# Patient Record
Sex: Male | Born: 1955 | Race: White | Hispanic: No | Marital: Single | Smoking: Current every day smoker
Health system: Southern US, Community
[De-identification: ages and names within clinical notes are randomized; demographics above are authoritative.]

## PROBLEM LIST (undated history)

## (undated) DIAGNOSIS — F329 Major depressive disorder, single episode, unspecified: Secondary | ICD-10-CM

## (undated) DIAGNOSIS — F32A Depression, unspecified: Secondary | ICD-10-CM

## (undated) DIAGNOSIS — M109 Gout, unspecified: Secondary | ICD-10-CM

## (undated) DIAGNOSIS — F419 Anxiety disorder, unspecified: Secondary | ICD-10-CM

## (undated) DIAGNOSIS — I10 Essential (primary) hypertension: Secondary | ICD-10-CM

---

## 2005-01-26 ENCOUNTER — Emergency Department: Payer: Self-pay | Admitting: Emergency Medicine

## 2005-12-27 ENCOUNTER — Emergency Department: Payer: Self-pay | Admitting: General Practice

## 2006-06-01 ENCOUNTER — Emergency Department: Payer: Self-pay | Admitting: Emergency Medicine

## 2007-05-04 ENCOUNTER — Emergency Department: Payer: Self-pay | Admitting: Internal Medicine

## 2007-05-21 ENCOUNTER — Emergency Department: Payer: Self-pay | Admitting: Unknown Physician Specialty

## 2008-07-04 ENCOUNTER — Emergency Department: Payer: Self-pay | Admitting: Emergency Medicine

## 2009-05-25 ENCOUNTER — Emergency Department: Payer: Self-pay | Admitting: Unknown Physician Specialty

## 2010-01-21 ENCOUNTER — Emergency Department: Payer: Self-pay | Admitting: Emergency Medicine

## 2012-03-17 ENCOUNTER — Emergency Department: Payer: Self-pay | Admitting: Emergency Medicine

## 2012-03-17 LAB — CBC
HCT: 40.1 % (ref 40.0–52.0)
HGB: 13 g/dL (ref 13.0–18.0)
MCH: 25.9 pg — ABNORMAL LOW (ref 26.0–34.0)
MCHC: 32.5 g/dL (ref 32.0–36.0)
Platelet: 325 10*3/uL (ref 150–440)
RDW: 15.4 % — ABNORMAL HIGH (ref 11.5–14.5)

## 2012-03-17 LAB — URINALYSIS, COMPLETE
Bacteria: NONE SEEN
Ketone: NEGATIVE
Leukocyte Esterase: NEGATIVE
Nitrite: NEGATIVE
Ph: 5 (ref 4.5–8.0)
Protein: NEGATIVE
Squamous Epithelial: <1

## 2012-03-17 LAB — COMPREHENSIVE METABOLIC PANEL
Albumin: 4.5 g/dL (ref 3.4–5.0)
Alkaline Phosphatase: 92 U/L (ref 50–136)
Anion Gap: 11 (ref 7–16)
BUN: 16 mg/dL (ref 7–18)
Bilirubin,Total: 0.5 mg/dL (ref 0.2–1.0)
Calcium, Total: 9.2 mg/dL (ref 8.5–10.1)
Creatinine: 1.54 mg/dL — ABNORMAL HIGH (ref 0.60–1.30)
EGFR (African American): >60
EGFR (Non-African Amer.): 50 — ABNORMAL LOW
Glucose: 103 mg/dL — ABNORMAL HIGH (ref 65–99)
Osmolality: 281 (ref 275–301)
Potassium: 3.5 mmol/L (ref 3.5–5.1)

## 2012-03-17 LAB — DRUG SCREEN, URINE
Amphetamines, Ur Screen: NEGATIVE (ref ?–1000)
Benzodiazepine, Ur Scrn: NEGATIVE (ref ?–200)
Cannabinoid 50 Ng, Ur ~~LOC~~: NEGATIVE (ref ?–50)
MDMA (Ecstasy)Ur Screen: NEGATIVE (ref ?–500)
Opiate, Ur Screen: NEGATIVE (ref ?–300)

## 2012-03-17 LAB — ETHANOL: Ethanol: <3 mg/dL

## 2012-03-17 LAB — TSH: Thyroid Stimulating Horm: 0.94 u[IU]/mL

## 2012-07-19 ENCOUNTER — Ambulatory Visit: Payer: Self-pay

## 2012-08-01 ENCOUNTER — Encounter (HOSPITAL_COMMUNITY): Payer: Self-pay | Admitting: Emergency Medicine

## 2012-08-01 ENCOUNTER — Emergency Department (HOSPITAL_COMMUNITY)
Admission: EM | Admit: 2012-08-01 | Discharge: 2012-08-02 | Disposition: A | Discharge status: Home / Self Care | Payer: Self-pay | Attending: Emergency Medicine | Admitting: Emergency Medicine

## 2012-08-01 DIAGNOSIS — Z760 Encounter for issue of repeat prescription: Secondary | ICD-10-CM | POA: Insufficient documentation

## 2012-08-01 DIAGNOSIS — F1010 Alcohol abuse, uncomplicated: Secondary | ICD-10-CM | POA: Insufficient documentation

## 2012-08-01 DIAGNOSIS — M109 Gout, unspecified: Secondary | ICD-10-CM | POA: Insufficient documentation

## 2012-08-01 DIAGNOSIS — F17200 Nicotine dependence, unspecified, uncomplicated: Secondary | ICD-10-CM | POA: Insufficient documentation

## 2012-08-01 DIAGNOSIS — Z79899 Other long term (current) drug therapy: Secondary | ICD-10-CM | POA: Insufficient documentation

## 2012-08-01 DIAGNOSIS — F341 Dysthymic disorder: Secondary | ICD-10-CM | POA: Insufficient documentation

## 2012-08-01 DIAGNOSIS — I10 Essential (primary) hypertension: Secondary | ICD-10-CM | POA: Insufficient documentation

## 2012-08-01 DIAGNOSIS — M7989 Other specified soft tissue disorders: Secondary | ICD-10-CM | POA: Insufficient documentation

## 2012-08-01 HISTORY — DX: Major depressive disorder, single episode, unspecified: F32.9

## 2012-08-01 HISTORY — DX: Essential (primary) hypertension: I10

## 2012-08-01 HISTORY — DX: Depression, unspecified: F32.A

## 2012-08-01 HISTORY — DX: Gout, unspecified: M10.9

## 2012-08-01 HISTORY — DX: Anxiety disorder, unspecified: F41.9

## 2012-08-01 LAB — COMPREHENSIVE METABOLIC PANEL
ALT: 31 U/L (ref 0–53)
AST: 21 U/L (ref 0–37)
Alkaline Phosphatase: 82 U/L (ref 39–117)
CO2: 23 mEq/L (ref 19–32)
Calcium: 9.7 mg/dL (ref 8.4–10.5)
GFR calc Af Amer: >90 mL/min (ref >90)
GFR calc non Af Amer: >90 mL/min (ref >90)
Glucose, Bld: 93 mg/dL (ref 70–99)
Potassium: 3.6 mEq/L (ref 3.5–5.1)
Sodium: 142 mEq/L (ref 135–145)
Total Protein: 7.2 g/dL (ref 6.0–8.3)

## 2012-08-01 LAB — CBC WITH DIFFERENTIAL/PLATELET
Basophils Absolute: 0 10*3/uL (ref 0.0–0.1)
Lymphocytes Relative: 25 % (ref 12–46)
Lymphs Abs: 2.2 10*3/uL (ref 0.7–4.0)
Neutrophils Relative %: 68 % (ref 43–77)
Platelets: 283 10*3/uL (ref 150–400)
RBC: 4.82 MIL/uL (ref 4.22–5.81)
RDW: 14.4 % (ref 11.5–15.5)
WBC: 8.9 10*3/uL (ref 4.0–10.5)

## 2012-08-01 LAB — RAPID URINE DRUG SCREEN, HOSP PERFORMED
Barbiturates: NOT DETECTED
Benzodiazepines: NOT DETECTED
Cocaine: NOT DETECTED
Tetrahydrocannabinol: NOT DETECTED

## 2012-08-01 MED ORDER — VITAMIN B-1 100 MG PO TABS
100.0000 mg | ORAL_TABLET | Freq: Every day | ORAL | Status: DC
  Administered 2012-08-01: 100 mg via ORAL
  Filled 2012-08-01 (×2): qty 1

## 2012-08-01 MED ORDER — ACETAMINOPHEN 325 MG PO TABS: 650.0000 mg | ORAL_TABLET | ORAL | Status: DC | PRN

## 2012-08-01 MED ORDER — LORAZEPAM 1 MG PO TABS
0.0000 mg | ORAL_TABLET | Freq: Four times a day (QID) | ORAL | Status: DC
  Administered 2012-08-01: 1 mg via ORAL
  Filled 2012-08-01: qty 1

## 2012-08-01 MED ORDER — ADULT MULTIVITAMIN W/MINERALS CH
1.0000 | ORAL_TABLET | Freq: Every day | ORAL | Status: DC
  Administered 2012-08-01 – 2012-08-02 (×2): 1 via ORAL
  Filled 2012-08-01 (×2): qty 1

## 2012-08-01 MED ORDER — ONDANSETRON HCL 8 MG PO TABS: 4.0000 mg | ORAL_TABLET | Freq: Three times a day (TID) | ORAL | Status: DC | PRN

## 2012-08-01 MED ORDER — ASENAPINE MALEATE 10 MG SL SUBL
1.0000 | SUBLINGUAL_TABLET | Freq: Two times a day (BID) | SUBLINGUAL | Status: DC
  Administered 2012-08-01 – 2012-08-02 (×3): 10 mg via SUBLINGUAL
  Filled 2012-08-01 (×3): qty 1

## 2012-08-01 MED ORDER — VERAPAMIL HCL ER 240 MG PO TBCR
240.0000 mg | EXTENDED_RELEASE_TABLET | Freq: Two times a day (BID) | ORAL | Status: DC
  Administered 2012-08-01 – 2012-08-02 (×3): 240 mg via ORAL
  Filled 2012-08-01 (×3): qty 1

## 2012-08-01 MED ORDER — ALBUTEROL SULFATE HFA 108 (90 BASE) MCG/ACT IN AERS: 2.0000 | INHALATION_SPRAY | Freq: Four times a day (QID) | RESPIRATORY_TRACT | Status: DC | PRN

## 2012-08-01 MED ORDER — NICOTINE 21 MG/24HR TD PT24
21.0000 mg | MEDICATED_PATCH | Freq: Every day | TRANSDERMAL | Status: DC
  Administered 2012-08-01: 21 mg via TRANSDERMAL
  Filled 2012-08-01: qty 1

## 2012-08-01 MED ORDER — HYDROXYZINE HCL 25 MG PO TABS: 25.0000 mg | ORAL_TABLET | Freq: Four times a day (QID) | ORAL | Status: DC | PRN

## 2012-08-01 MED ORDER — FOLIC ACID 1 MG PO TABS
1.0000 mg | ORAL_TABLET | Freq: Every day | ORAL | Status: DC
  Administered 2012-08-01 – 2012-08-02 (×2): 1 mg via ORAL
  Filled 2012-08-01 (×2): qty 1

## 2012-08-01 MED ORDER — LORAZEPAM 2 MG/ML IJ SOLN: 1.0000 mg | Freq: Four times a day (QID) | INTRAMUSCULAR | Status: DC | PRN

## 2012-08-01 MED ORDER — THIAMINE HCL 100 MG/ML IJ SOLN: 100.0000 mg | Freq: Every day | INTRAMUSCULAR | Status: DC

## 2012-08-01 MED ORDER — LORAZEPAM 1 MG PO TABS: 0.0000 mg | ORAL_TABLET | Freq: Two times a day (BID) | ORAL | Status: DC

## 2012-08-01 MED ORDER — LORAZEPAM 1 MG PO TABS
1.0000 mg | ORAL_TABLET | Freq: Four times a day (QID) | ORAL | Status: DC | PRN
  Administered 2012-08-01: 1 mg via ORAL
  Filled 2012-08-01: qty 1

## 2012-08-01 NOTE — ED Provider Notes (Signed)
History  This chart was scribed for Kurt Octave, MD by Bennett Scrape. This patient was seen in room TR04C/TR04C and the patient's care was started at 11:18AM.  CSN: [id number]  Arrival date & time 08/01/12  1052   First MD Initiated Contact with Patient 08/01/12 1118      Chief Complaint  Patient presents with  . Medical Clearance  . Medication Refill    The history is provided by the patient. No language interpreter was used.    Kurt Harris is a 56 y.o. male who presents to the Emergency Department requesting to be medically cleared for admission to [facility]. He has being at [facility] for alcohol abuse until this morning but was turned out due to financial issues.  He states that he has been talking with the director of [facility] and reports that he was offered a spot if he could be medically cleared first. He reports that his last alcohol consuption was 2 hours ago. He denies drinking everyday stating that it's "only when I have issues". He c/o bilateral leg swelling and reports that he does usually get tremors when going through EtOH withdrawal. He denies CP, abdominal pain and SOB. He ha sa  H/o HTN, gout (no recent episodes) and depression and reports that he takes his daily medications as prescribed but is almost out of his Verapamil and Atarax. He is also a current everyday smoker.  Past Medical History  Diagnosis Date  . Hypertension   . Gout   . Depression   . Anxiety     History reviewed. No pertinent past surgical history.  History reviewed. No pertinent family history.  History  Substance Use Topics  . Smoking status: Current Everyday Smoker  . Smokeless tobacco: Not on file  . Alcohol Use: No      Review of Systems  A complete 10 system review of systems was obtained and all systems are negative except as noted in the HPI and PMH.   Allergies  Lisinopril  Home Medications   Current Outpatient Rx  Name Route Sig Dispense Refill  .  ALBUTEROL SULFATE HFA 108 (90 BASE) MCG/ACT IN AERS Inhalation Inhale 2 puffs into the lungs every 6 (six) hours as needed. For wheezing    . ASENAPINE MALEATE 10 MG SL SUBL Sublingual Place 1 tablet under the tongue 2 (two) times daily.    Marland Kitchen HYDROXYZINE HCL 25 MG PO TABS Oral Take 25 mg by mouth every 6 (six) hours as needed. For anxiety    . VERAPAMIL HCL ER 240 MG PO TBCR Oral Take 240 mg by mouth 2 (two) times daily.      Triage Vitals: BP 151/90  Pulse 105  Temp 98.5 F (36.9 C) (Oral)  Resp 18  SpO2 98%  Physical Exam  Nursing note and vitals reviewed. Constitutional: He is oriented to person, place, and time. He appears well-developed and well-nourished. No distress.       smells of alcohol  HENT:  Head: Normocephalic and atraumatic.  Eyes: Conjunctivae and EOM are normal.  Neck: Normal range of motion. Neck supple.  Cardiovascular: Tachycardia present.        Mildly tachycardic  Pulmonary/Chest: Effort normal and breath sounds normal.  Abdominal: Soft. There is no tenderness.  Musculoskeletal: Normal range of motion. He exhibits edema (1+ pitting edema bilaterally).  Neurological: He is alert and oriented to person, place, and time. He has normal strength. No sensory deficit. He displays no seizure activity.  Skin:  Skin is warm and dry.  Psychiatric: He has a normal mood and affect.    ED Course  Procedures (including critical care time)  DIAGNOSTIC STUDIES: Oxygen Saturation is 98% on room air, normal by my interpretation.    COORDINATION OF CARE: 11:34AM-Discussed treatment plan which includes blood work with pt at bedside and pt agreed to plan.   Labs Reviewed  CBC WITH DIFFERENTIAL - Abnormal; Notable for the following:    Hemoglobin 12.4 (*)     HCT 37.9 (*)     MCH 25.7 (*)     All other components within normal limits  ETHANOL - Abnormal; Notable for the following:    Alcohol, Ethyl (B) 29 (*)     All other components within normal limits    COMPREHENSIVE METABOLIC PANEL  URINE RAPID DRUG SCREEN (HOSP PERFORMED)   No results found.   No diagnosis found.    MDM  Alcoholic who is now homeless.  Requesting medical clearance for [facility].  No SI or HI.  Last drink just PTA.  Labs, CIWA protocol, d/w ACT team.   I personally performed the services described in this documentation, which was scribed in my presence.  The recorded information has been reviewed and considered.       Kurt Octave, MD 08/01/12 1308

## 2012-08-01 NOTE — ED Notes (Signed)
Requested order for a nicotine patch and placed in the computer as a verbal order from Dr. Rubin Payor

## 2012-08-01 NOTE — BH Assessment (Signed)
Assessment Note   Kurt Harris is an 56 y.o. male that is self-referred for SA treatment.  Pt stated he called [facility] and was told he had to be med cleared and go through detox before treatment.  Pt stated he wants detox from alcohol.  Pt stated he was recently released from [facility] (did not give date) from treatment, went to [facility] and did not have money to stay there, so was kicked out be report.  Pt reports he has been drinking 2-3 6 packs of beer daily, last use today and pt had one 24 oz beer.  Pt denies current mental health issues other than some depressive sx.  Pt stated he has a diagnosis of Bipolar Disorder and received his prescription for psychotropic medication from [facility].  Pt denies SI/HI or psychosis.  Pt has had no other SA or MH treatment by report other than his recent treatment at [facility].  Called [facility] and beds available per [facility] @ 1305.  Referral faxed for review.  Updated ED staff.  Completed assessment, assessment notification and faxed to [facility] to log.                                       Axis I: Alcohol Dependence, Bipolar I Disorder, MRE Depressed, Moderate Axis II: Deferred Axis III:  Past Medical History  Diagnosis Date  . Hypertension   . Gout   . Depression   . Anxiety    Axis IV: housing problems, occupational problems and other psychosocial or environmental problems Axis V: 31-40 impairment in reality testing  Past Medical History:  Past Medical History  Diagnosis Date  . Hypertension   . Gout   . Depression   . Anxiety     History reviewed. No pertinent past surgical history.  Family History: History reviewed. No pertinent family history.  Social History:  reports that he has been smoking.  He does not have any smokeless tobacco history on file. He reports that he drinks alcohol. He reports that he does not use illicit drugs.  Additional Social History:  Alcohol / Drug Use Pain Medications: see list Prescriptions: see list Over the Counter: see  list History of alcohol / drug use?: Yes Substance #1 Name of Substance 1: Alcohol - Beer 1 - Age of First Use: 16 1 - Amount (size/oz): 2-3 6 packs beer per day 1 - Frequency: Daily 1 - Duration: Ongoing 1 - Last Use / Amount: today - 1 beer  CIWA: CIWA-Ar BP: 151/90 mmHg Pulse Rate: 105  Nausea and Vomiting: no nausea and no vomiting Tactile Disturbances: none Tremor: no tremor Auditory Disturbances: not present Paroxysmal Sweats: no sweat visible Visual Disturbances: not present Anxiety: five Headache, Fullness in Head: none present Agitation: normal activity Orientation and Clouding of Sensorium: oriented and can do serial additions CIWA-Ar Total: 5  COWS:    Allergies:  Allergies  Allergen Reactions  . Lisinopril Rash    Home Medications:  (Not in a hospital admission)  OB/GYN Status:  No LMP for male patient.  General Assessment Data Location of Assessment: [facility] ED Living Arrangements: Other (Comment) (Homeless) Can pt return to current living arrangement?: Yes Admission Status: Voluntary Is patient capable of signing voluntary admission?: Yes Transfer from: Acute Hospital Referral Source: Self/Family/Friend  Education Status Is patient currently in school?: No  Risk to self Suicidal Ideation: No Suicidal Intent: No Is patient at  risk for suicide?: No Suicidal Plan?: No Access to Means: No What has been your use of drugs/alcohol within the last 12 months?: Dailys use of alcohol Previous Attempts/Gestures: No How many times?: 0  Other Self Harm Risks: pt denies Triggers for Past Attempts: None known Intentional Self Injurious Behavior: None Family Suicide History: No Recent stressful life event(s): Other (Comment);Financial Problems (pt is homeless) Persecutory voices/beliefs?: No Depression: Yes Depression Symptoms: Despondent;Insomnia;Feeling worthless/self pity;Feeling angry/irritable Substance abuse history and/or treatment for substance  abuse?: Yes Suicide prevention information given to non-admitted patients: Not applicable  Risk to Others Homicidal Ideation: No Thoughts of Harm to Others: No Current Homicidal Intent: No Current Homicidal Plan: No Access to Homicidal Means: No Identified Victim: na History of harm to others?: No Assessment of Violence: None Noted Violent Behavior Description: na - pt calm, cooperative Does patient have access to weapons?: No Criminal Charges Pending?: No Does patient have a court date: No  Psychosis Hallucinations: None noted Delusions: None noted  Mental Status Report Appear/Hygiene: Other (Comment) (casual) Eye Contact: Good Motor Activity: Unremarkable Speech: Logical/coherent Level of Consciousness: Alert Mood: Depressed Affect: Appropriate to circumstance Anxiety Level: Minimal Thought Processes: Coherent;Relevant Judgement: Impaired Orientation: Person;Place;Time;Situation Obsessive Compulsive Thoughts/Behaviors: None  Cognitive Functioning Concentration: Normal Memory: Recent Intact;Remote Intact IQ: Average Insight: Fair Impulse Control: Poor Appetite: Good Weight Loss: 0  Weight Gain: 0  Sleep: Decreased Total Hours of Sleep:  (4-5 hrs per night) Vegetative Symptoms: None  ADLScreening [facility] Assessment Services) Patient's cognitive ability adequate to safely complete daily activities?: Yes Patient able to express need for assistance with ADLs?: Yes Independently performs ADLs?: Yes (appropriate for developmental age)  Abuse/Neglect [facility]) Physical Abuse: Denies Verbal Abuse: Yes, past (Comment) (states was bullied at school as a child) Sexual Abuse: Denies  Prior Inpatient Therapy Prior Inpatient Therapy: Yes Prior Therapy Dates: 2013 Prior Therapy Facilty/Provider(s): [facility] Reason for Treatment: SA treatment  Prior Outpatient Therapy Prior Outpatient Therapy: No Prior Therapy Dates: na Prior Therapy Facilty/Provider(s): na Reason for  Treatment: na  ADL Screening (condition at time of admission) Patient's cognitive ability adequate to safely complete daily activities?: Yes Patient able to express need for assistance with ADLs?: Yes Independently performs ADLs?: Yes (appropriate for developmental age) Weakness of Legs: None Weakness of Arms/Hands: None  Home Assistive Devices/Equipment Home Assistive Devices/Equipment: None    Abuse/Neglect Assessment (Assessment to be complete while patient is alone) Physical Abuse: Denies Verbal Abuse: Yes, past (Comment) (states was bullied at school as a child) Sexual Abuse: Denies Exploitation of patient/patient's resources: Denies Self-Neglect: Denies Values / Beliefs Cultural Requests During Hospitalization: None Spiritual Requests During Hospitalization: None Consults Spiritual Care Consult Needed: No Social Work Consult Needed: No Merchant navy officer (For Healthcare) Advance Directive: Patient does not have advance directive;Patient would not like information    Additional Information 1:1 In Past 12 Months?: No CIRT Risk: No Elopement Risk: No Does patient have medical clearance?: Yes     Disposition:  Disposition Disposition of Patient: Referred to;Inpatient treatment program (Penidng [facility]) Type of inpatient treatment program: Adult Patient referred to: [facility] ([facility]ending [facility])  On Site Evaluation by:   Reviewed with Physician:  Rancour   Caryl Comes 08/01/2012 1:40 PM

## 2012-08-01 NOTE — ED Notes (Signed)
Pt here requesting medical clearance so can go to [facility] for housing until another facility can be located; pt sts almost out of meds also; pt has not other complaint

## 2012-08-01 NOTE — ED Notes (Signed)
Pt states he spoke with Eber Jones [phone]) at [facility] and was informed that he would have placement following medical clearance.  Pt states he was staying at [facility] but was evicted last night due to inability to pay for room.  Pt states he is "running low" on medications and is need assistance in getting his rx filled.

## 2012-08-02 MED ORDER — VERAPAMIL HCL ER 240 MG PO TBCR: 240.0000 mg | EXTENDED_RELEASE_TABLET | Freq: Two times a day (BID) | ORAL | Status: AC

## 2012-08-02 MED ORDER — HYDROXYZINE HCL 25 MG PO TABS: 25.0000 mg | ORAL_TABLET | Freq: Four times a day (QID) | ORAL | Status: AC | PRN

## 2012-08-02 MED ORDER — ASENAPINE MALEATE 10 MG SL SUBL: 1.0000 | SUBLINGUAL_TABLET | Freq: Two times a day (BID) | SUBLINGUAL | Status: AC

## 2012-08-02 MED ORDER — ALBUTEROL SULFATE HFA 108 (90 BASE) MCG/ACT IN AERS: 2.0000 | INHALATION_SPRAY | Freq: Four times a day (QID) | RESPIRATORY_TRACT | Status: AC | PRN

## 2012-08-02 NOTE — ED Notes (Signed)
[name]-case mgmt-[phone].

## 2012-08-02 NOTE — BH Assessment (Signed)
[facility] Assessment Progress Note    Patient is wanting to go to [facility] for treatment.  This clinician called [facility] and spoke to [place] there and he said that patient had been uncooperative when he stayed there last and is not able to return at this time.   Clinician called [facility] and Dennie Bible there said that he looked like a good candidate for a rehabilitation bed but that he would have to apply during the day or at least have the daytime clinician call about trying to get him into this type of bed during the day.  Dennie Bible also said that the patient would need a two week supply of his home medications that are listed in the ED notes.  At this time patient would come up short on his hydroxizine and trazadone.  Patient is unsure about whether he is taking his medications as prescribed anyway.  Saying "I don't know what I'm taking and don't take it all the time anyway." Clinician did call [facility] in [place] to see if they had any opening and they do not.  They did say that we could call them back later in the morning. Patient is upset, saying he does not want to leave until he has a bed someplace.  He does not accept that he may have to apply for the services himself or that he will simply have to wait until daytime.  He says, "I don't want to have to go out and be wandering the streets." On-coming clinician should contact hospital social work regarding whether patient can get some of his medications filled to be able to go to [facility] for 14 days.

## 2012-08-02 NOTE — ED Notes (Signed)
This rn spoke with [name]-case management- stated pt could leave once doc orders 2 weeks of meds!  This rn advised dr. Ignacia Palma.  [name] stated [facility] picks up pt.

## 2012-08-02 NOTE — ED Notes (Signed)
Housekeeper asked pt could she come in and clean/straighten up the room, pt agreed. Then after a few minutes of her being in the room, pt didn't want the housekeeper in his room any longer. Housekeeper came out and said she couldn't finish cleaning the room because the pt stated "you shouldn't clean when pt's are in the room. You can go now"; RN notified

## 2012-08-02 NOTE — ED Notes (Signed)
Pt upset and wants to know when he is going to leave; told pt I would let the nurse know; RN notified

## 2012-08-02 NOTE — ED Notes (Signed)
[name] delivered Rx from Dr Ignacia Palma to pharmacy to fill.  [name] aware pt is up for discharge.

## 2012-08-02 NOTE — Progress Notes (Signed)
Contacted by SW Delice Bison to obtain 14-days of medications for patient so he can go to [facility] today. Notified attending nurse that we need scripts so can get filled.

## 2012-08-02 NOTE — ED Notes (Signed)
Spoke with case mgr-[name]-she called and advised catherine with ACT team that 2 weeks supply of drugs in POD C.  Pharmacy unable to fill Asenapine Maleate.

## 2012-08-02 NOTE — ED Notes (Signed)
Pt refused ativan- pt not exhibiting any s/sx of anxiety and/or agitation.

## 2012-09-02 ENCOUNTER — Emergency Department: Payer: Self-pay | Admitting: *Deleted

## 2012-09-02 LAB — CBC
HCT: 39.6 % — ABNORMAL LOW (ref 40.0–52.0)
MCV: 77 fL — ABNORMAL LOW (ref 80–100)
Platelet: 309 10*3/uL (ref 150–440)
RBC: 5.12 10*6/uL (ref 4.40–5.90)
WBC: 14.9 10*3/uL — ABNORMAL HIGH (ref 3.8–10.6)

## 2012-09-02 LAB — URINALYSIS, COMPLETE
Bacteria: NONE SEEN
Blood: NEGATIVE
Glucose,UR: NEGATIVE mg/dL (ref 0–75)
Leukocyte Esterase: NEGATIVE
Nitrite: NEGATIVE

## 2012-09-02 LAB — DRUG SCREEN, URINE
Amphetamines, Ur Screen: NEGATIVE (ref ?–1000)
Barbiturates, Ur Screen: NEGATIVE (ref ?–200)
Benzodiazepine, Ur Scrn: NEGATIVE (ref ?–200)
Cannabinoid 50 Ng, Ur ~~LOC~~: NEGATIVE (ref ?–50)
Cocaine Metabolite,Ur ~~LOC~~: NEGATIVE (ref ?–300)
Methadone, Ur Screen: NEGATIVE (ref ?–300)
Phencyclidine (PCP) Ur S: NEGATIVE (ref ?–25)
Tricyclic, Ur Screen: NEGATIVE (ref ?–1000)

## 2012-09-02 LAB — COMPREHENSIVE METABOLIC PANEL
Albumin: 4.5 g/dL (ref 3.4–5.0)
Alkaline Phosphatase: 97 U/L (ref 50–136)
Bilirubin,Total: 0.9 mg/dL (ref 0.2–1.0)
Creatinine: 0.89 mg/dL (ref 0.60–1.30)
Glucose: 94 mg/dL (ref 65–99)
Osmolality: 271 (ref 275–301)
Sodium: 136 mmol/L (ref 136–145)
Total Protein: 7.8 g/dL (ref 6.4–8.2)

## 2012-09-02 LAB — ETHANOL: Ethanol %: <0.003 % (ref 0.000–0.080)

## 2012-09-17 ENCOUNTER — Emergency Department: Payer: Self-pay | Admitting: Emergency Medicine

## 2012-09-17 LAB — CBC
HCT: 40.2 % (ref 40.0–52.0)
HGB: 12.9 g/dL — ABNORMAL LOW (ref 13.0–18.0)
MCV: 78 fL — ABNORMAL LOW (ref 80–100)
WBC: 13 10*3/uL — ABNORMAL HIGH (ref 3.8–10.6)

## 2012-09-17 LAB — URINALYSIS, COMPLETE
Bacteria: NONE SEEN
Bilirubin,UR: NEGATIVE
Blood: NEGATIVE
Glucose,UR: NEGATIVE mg/dL (ref 0–75)
Leukocyte Esterase: NEGATIVE
Nitrite: NEGATIVE
Squamous Epithelial: NONE SEEN
WBC UR: NONE SEEN /HPF (ref 0–5)

## 2012-09-17 LAB — BASIC METABOLIC PANEL
Calcium, Total: 9.3 mg/dL (ref 8.5–10.1)
Co2: 26 mmol/L (ref 21–32)
EGFR (Non-African Amer.): >60
Osmolality: 277 (ref 275–301)
Potassium: 4.2 mmol/L (ref 3.5–5.1)
Sodium: 138 mmol/L (ref 136–145)

## 2012-09-17 LAB — DRUG SCREEN, URINE
Barbiturates, Ur Screen: NEGATIVE (ref ?–200)
Benzodiazepine, Ur Scrn: NEGATIVE (ref ?–200)
Methadone, Ur Screen: NEGATIVE (ref ?–300)
Opiate, Ur Screen: NEGATIVE (ref ?–300)

## 2012-09-17 LAB — ACETAMINOPHEN LEVEL: Acetaminophen: <2 ug/mL

## 2012-12-25 ENCOUNTER — Emergency Department: Payer: Self-pay | Admitting: Emergency Medicine

## 2012-12-25 LAB — URINALYSIS, COMPLETE
Bilirubin,UR: NEGATIVE
Glucose,UR: NEGATIVE mg/dL (ref 0–75)
Nitrite: NEGATIVE
Ph: 5 (ref 4.5–8.0)
Protein: NEGATIVE
RBC,UR: <1 /HPF (ref 0–5)
Specific Gravity: 1.018 (ref 1.003–1.030)
Squamous Epithelial: NONE SEEN

## 2012-12-25 LAB — LIPASE, BLOOD: Lipase: 1012 U/L — ABNORMAL HIGH (ref 73–393)

## 2012-12-25 LAB — COMPREHENSIVE METABOLIC PANEL
Albumin: 4.6 g/dL (ref 3.4–5.0)
Anion Gap: 7 (ref 7–16)
Calcium, Total: 9.2 mg/dL (ref 8.5–10.1)
Creatinine: 0.93 mg/dL (ref 0.60–1.30)
Osmolality: 280 (ref 275–301)
Potassium: 3.9 mmol/L (ref 3.5–5.1)
SGOT(AST): 18 U/L (ref 15–37)
Total Protein: 7.7 g/dL (ref 6.4–8.2)

## 2012-12-25 LAB — CBC
HCT: 38.6 % — ABNORMAL LOW (ref 40.0–52.0)
MCV: 80 fL (ref 80–100)
Platelet: 296 10*3/uL (ref 150–440)

## 2013-01-06 ENCOUNTER — Emergency Department: Payer: Self-pay | Admitting: Emergency Medicine

## 2013-01-06 LAB — COMPREHENSIVE METABOLIC PANEL
Alkaline Phosphatase: 95 U/L (ref 50–136)
Bilirubin,Total: 0.4 mg/dL (ref 0.2–1.0)
Calcium, Total: 9.2 mg/dL (ref 8.5–10.1)
Chloride: 102 mmol/L (ref 98–107)
Creatinine: 0.94 mg/dL (ref 0.60–1.30)
EGFR (African American): >60
Glucose: 85 mg/dL (ref 65–99)
Osmolality: 272 (ref 275–301)
Potassium: 3.9 mmol/L (ref 3.5–5.1)
Total Protein: 8 g/dL (ref 6.4–8.2)

## 2013-01-06 LAB — CBC
HCT: 40.6 % (ref 40.0–52.0)
MCH: 25.5 pg — ABNORMAL LOW (ref 26.0–34.0)
MCHC: 32 g/dL (ref 32.0–36.0)
Platelet: 298 10*3/uL (ref 150–440)
RDW: 14.8 % — ABNORMAL HIGH (ref 11.5–14.5)

## 2013-01-06 LAB — URINALYSIS, COMPLETE
Bilirubin,UR: NEGATIVE
Glucose,UR: NEGATIVE mg/dL (ref 0–75)
Ketone: NEGATIVE
Nitrite: NEGATIVE
Squamous Epithelial: NONE SEEN

## 2013-05-19 ENCOUNTER — Emergency Department: Payer: Self-pay | Admitting: Emergency Medicine

## 2013-05-19 LAB — COMPREHENSIVE METABOLIC PANEL
Albumin: 4.3 g/dL (ref 3.4–5.0)
Alkaline Phosphatase: 81 U/L (ref 50–136)
Bilirubin,Total: 0.3 mg/dL (ref 0.2–1.0)
Creatinine: 1.14 mg/dL (ref 0.60–1.30)
EGFR (African American): >60
EGFR (Non-African Amer.): >60
Osmolality: 272 (ref 275–301)
Potassium: 3.7 mmol/L (ref 3.5–5.1)
Sodium: 135 mmol/L — ABNORMAL LOW (ref 136–145)
Total Protein: 7.4 g/dL (ref 6.4–8.2)

## 2013-05-19 LAB — LIPASE, BLOOD: Lipase: 153 U/L (ref 73–393)

## 2013-05-19 LAB — CBC: Platelet: 298 10*3/uL (ref 150–440)

## 2013-07-09 ENCOUNTER — Emergency Department: Payer: Self-pay | Admitting: Emergency Medicine

## 2013-07-09 LAB — DRUG SCREEN, URINE
Barbiturates, Ur Screen: NEGATIVE (ref ?–200)
Benzodiazepine, Ur Scrn: NEGATIVE (ref ?–200)
Cannabinoid 50 Ng, Ur ~~LOC~~: NEGATIVE (ref ?–50)
MDMA (Ecstasy)Ur Screen: NEGATIVE (ref ?–500)
Phencyclidine (PCP) Ur S: NEGATIVE (ref ?–25)

## 2013-07-09 LAB — COMPREHENSIVE METABOLIC PANEL
Anion Gap: 5 — ABNORMAL LOW (ref 7–16)
Bilirubin,Total: 1 mg/dL (ref 0.2–1.0)
Calcium, Total: 9.1 mg/dL (ref 8.5–10.1)
EGFR (African American): >60
EGFR (Non-African Amer.): >60
Osmolality: 270 (ref 275–301)
SGOT(AST): 18 U/L (ref 15–37)
SGPT (ALT): 22 U/L (ref 12–78)
Sodium: 136 mmol/L (ref 136–145)

## 2013-07-09 LAB — URINALYSIS, COMPLETE
Glucose,UR: NEGATIVE mg/dL (ref 0–75)
Ketone: NEGATIVE
Nitrite: NEGATIVE
Protein: NEGATIVE
RBC,UR: NONE SEEN /HPF (ref 0–5)
Specific Gravity: 1.002 (ref 1.003–1.030)
Squamous Epithelial: NONE SEEN

## 2013-07-09 LAB — ETHANOL: Ethanol %: 0.156 % — ABNORMAL HIGH (ref 0.000–0.080)

## 2013-07-09 LAB — CBC
HCT: 38.1 % — ABNORMAL LOW (ref 40.0–52.0)
MCH: 25.7 pg — ABNORMAL LOW (ref 26.0–34.0)
MCHC: 32.8 g/dL (ref 32.0–36.0)
MCV: 78 fL — ABNORMAL LOW (ref 80–100)
Platelet: 324 10*3/uL (ref 150–440)
RBC: 4.86 10*6/uL (ref 4.40–5.90)
RDW: 16.2 % — ABNORMAL HIGH (ref 11.5–14.5)

## 2014-02-23 IMAGING — CR DG ABDOMEN 1V
1 series · 1 of 1 positions shown · non-contrast
Comparison: none

REASON FOR EXAM: abdominal fullness
COMMENTS:   May transport without cardiac monitor

[t abdomen supine]
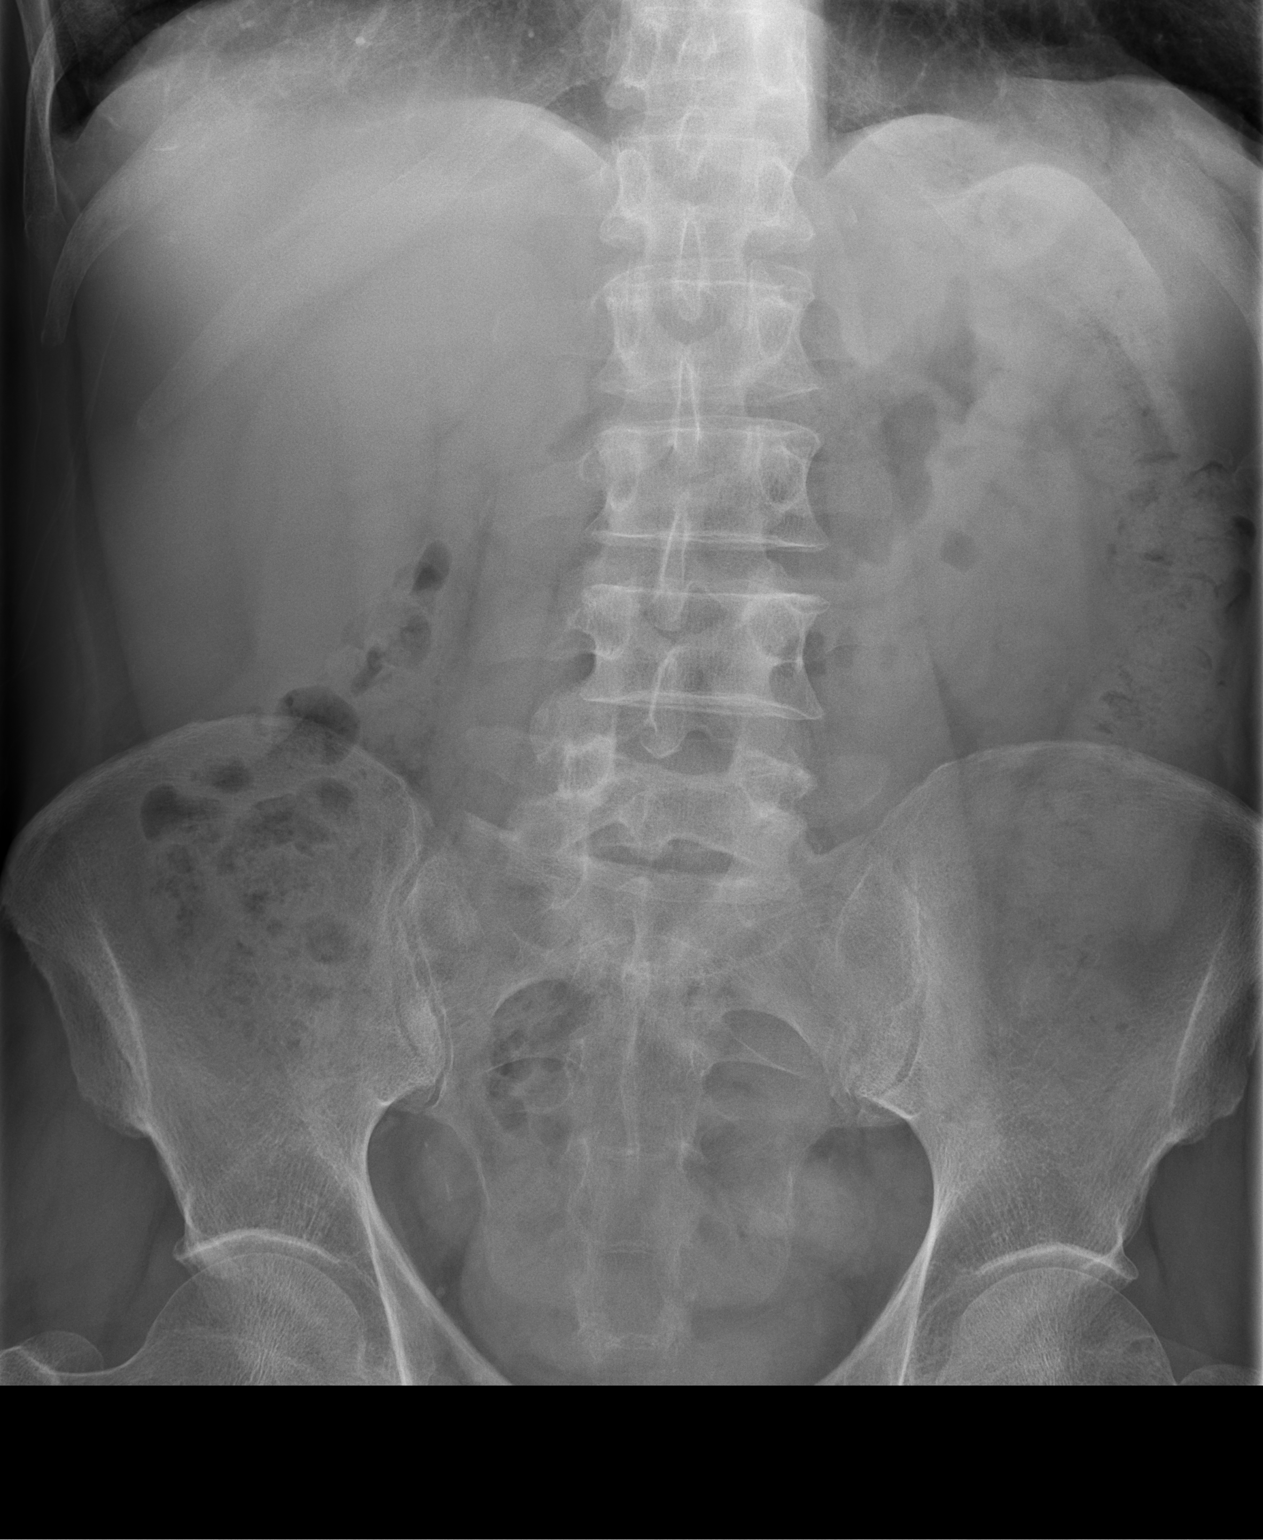

[1 of 1 positions shown; findings below may reference images not displayed]

PROCEDURE:     DXR - DXR KIDNEY URETER BLADDER  - May 19, 2013  [DATE]

RESULT:     The bowel gas pattern is within the limits of normal. There is
no evidence of a small or large bowel obstruction or ileus. I cannot exclude
mild constipation in the appropriate clinical setting. No abnormal soft
tissue calcifications are demonstrated. The bony structures are normal in
appearance.
IMPRESSION: No acute intra-abdominal abnormality is demonstrated.

[REDACTED]

## 2014-05-20 ENCOUNTER — Emergency Department: Payer: Self-pay | Admitting: Internal Medicine

## 2014-05-20 LAB — CBC
HCT: 35.4 % — ABNORMAL LOW (ref 40.0–52.0)
HGB: 11.1 g/dL — ABNORMAL LOW (ref 13.0–18.0)
MCH: 24.8 pg — ABNORMAL LOW (ref 26.0–34.0)
MCHC: 31.5 g/dL — ABNORMAL LOW (ref 32.0–36.0)
MCV: 79 fL — AB (ref 80–100)
Platelet: 276 10*3/uL (ref 150–440)
RBC: 4.49 10*6/uL (ref 4.40–5.90)
RDW: 15.5 % — AB (ref 11.5–14.5)
WBC: 9.8 10*3/uL (ref 3.8–10.6)

## 2014-05-20 LAB — COMPREHENSIVE METABOLIC PANEL
ALBUMIN: 4 g/dL (ref 3.4–5.0)
ALK PHOS: 78 U/L
ANION GAP: 6 — AB (ref 7–16)
AST: 18 U/L (ref 15–37)
BUN: 21 mg/dL — ABNORMAL HIGH (ref 7–18)
Bilirubin,Total: 0.8 mg/dL (ref 0.2–1.0)
CALCIUM: 9 mg/dL (ref 8.5–10.1)
CHLORIDE: 103 mmol/L (ref 98–107)
CO2: 27 mmol/L (ref 21–32)
Creatinine: 0.98 mg/dL (ref 0.60–1.30)
EGFR (African American): >60
EGFR (Non-African Amer.): >60
GLUCOSE: 96 mg/dL (ref 65–99)
Osmolality: 275 (ref 275–301)
Potassium: 3.6 mmol/L (ref 3.5–5.1)
SGPT (ALT): 23 U/L (ref 12–78)
Sodium: 136 mmol/L (ref 136–145)
Total Protein: 6.9 g/dL (ref 6.4–8.2)

## 2014-05-20 LAB — DRUG SCREEN, URINE

## 2014-05-20 LAB — SALICYLATE LEVEL: Salicylates, Serum: <1.7 mg/dL

## 2014-05-20 LAB — ETHANOL
Ethanol %: <0.003 % (ref 0.000–0.080)
Ethanol: <3 mg/dL

## 2014-05-20 LAB — URINALYSIS, COMPLETE
BILIRUBIN, UR: NEGATIVE
BLOOD: NEGATIVE
Bacteria: NONE SEEN
Glucose,UR: NEGATIVE mg/dL (ref 0–75)
Ketone: NEGATIVE
Nitrite: NEGATIVE
Ph: 5 (ref 4.5–8.0)
Protein: NEGATIVE
RBC,UR: 2 /HPF (ref 0–5)
SQUAMOUS EPITHELIAL: NONE SEEN
Specific Gravity: 1.017 (ref 1.003–1.030)
WBC UR: 10 /HPF (ref 0–5)

## 2014-05-20 LAB — ACETAMINOPHEN LEVEL: Acetaminophen: <2 ug/mL

## 2014-07-15 ENCOUNTER — Inpatient Hospital Stay: Payer: Self-pay | Admitting: Psychiatry

## 2014-07-15 LAB — COMPREHENSIVE METABOLIC PANEL
ALBUMIN: 4.1 g/dL (ref 3.4–5.0)
ALK PHOS: 70 U/L
ALT: 22 U/L
ANION GAP: 6 — AB (ref 7–16)
BILIRUBIN TOTAL: 0.5 mg/dL (ref 0.2–1.0)
BUN: 12 mg/dL (ref 7–18)
CALCIUM: 8.8 mg/dL (ref 8.5–10.1)
CO2: 29 mmol/L (ref 21–32)
CREATININE: 1.11 mg/dL (ref 0.60–1.30)
Chloride: 106 mmol/L (ref 98–107)
GLUCOSE: 88 mg/dL (ref 65–99)
OSMOLALITY: 280 (ref 275–301)
POTASSIUM: 3.7 mmol/L (ref 3.5–5.1)
SGOT(AST): 19 U/L (ref 15–37)
SODIUM: 141 mmol/L (ref 136–145)
TOTAL PROTEIN: 7.4 g/dL (ref 6.4–8.2)

## 2014-07-15 LAB — CBC
HCT: 38.5 % — AB (ref 40.0–52.0)
HGB: 12.2 g/dL — AB (ref 13.0–18.0)
MCH: 25.8 pg — AB (ref 26.0–34.0)
MCHC: 31.6 g/dL — AB (ref 32.0–36.0)
MCV: 82 fL (ref 80–100)
PLATELETS: 281 10*3/uL (ref 150–440)
RBC: 4.72 10*6/uL (ref 4.40–5.90)
RDW: 14.7 % — ABNORMAL HIGH (ref 11.5–14.5)
WBC: 4.5 10*3/uL (ref 3.8–10.6)

## 2014-07-15 LAB — URINALYSIS, COMPLETE
BLOOD: NEGATIVE
Bacteria: NONE SEEN
Bilirubin,UR: NEGATIVE
Glucose,UR: NEGATIVE mg/dL (ref 0–75)
KETONE: NEGATIVE
Leukocyte Esterase: NEGATIVE
NITRITE: NEGATIVE
PH: 5 (ref 4.5–8.0)
PROTEIN: NEGATIVE
SPECIFIC GRAVITY: 1.016 (ref 1.003–1.030)
Squamous Epithelial: NONE SEEN

## 2014-07-15 LAB — DRUG SCREEN, URINE

## 2015-03-30 NOTE — Consult Note (Signed)
Brief Consult Note: Diagnosis: Alcohol dependence.   Patient was seen by consultant.   Consult note dictated.   Recommend further assessment or treatment.   Orders entered.   Discussed with Attending MD.   Comments: [name] has a h/o alcoholism. He has not been able to maintain any sobriety except in supervised setting. He has been in our area for about six months and tried to participate in multiple substance abuse treatment programs. He has not been successful in any of them. He has a h/o leaning treatment prematurely.   PLAN: 1. The patient does not meet criteria for IVC.  2. He was referred to the [facility] in [place] for treatment. We are still awaiting their decison.  3. IF accepted, he will need a taxi to get there.  Electronic Signatures: Kurt Harris, Kurt Harris (MD)  (Signed 24-Sep-13 15:07)  Authored: Brief Consult Note   Last Updated: 24-Sep-13 15:07 by Kurt Harris, Kurt Harris (MD)

## 2015-03-30 NOTE — Consult Note (Signed)
PATIENT NAME:  Kurt Harris, Kurt Harris MR#:  [id number] DATE OF BIRTH:  September 28, 1956  DATE OF CONSULTATION:  09/03/2012  REFERRING PHYSICIAN:  Si Raider, M.D.  CONSULTING PHYSICIAN:  Sulo Janczak B. Pearson Picou, MD  REASON FOR CONSULTATION: To evaluate a patient with alcoholism.   IDENTIFYING DATA: Mr. Lagrange is a 59 year old male with history of alcoholism.   CHIEF COMPLAINT: I relapsed.   HISTORY OF PRESENT ILLNESS: Mr. Marcoux has a lifelong history of severe alcoholism. He was treated at [facility] rehab facility for two weeks. He was discharged three weeks ago. He reports a two week period of sobriety and then started drinking again. He went back to [facility]  trying to find a bed there, but he will not be accepted at [facility] as he left their treatment prematurely. He ended up in [place] but was put in a taxi by [employer] and sent to the homeless shelter in [place]. Unfortunately, he is not eligible for shelter for the next 90 days. He came to the Emergency Room asking for detox. The patient does not require detox. He has been at [facility] twice in the past week. He has not been drinking heavy following his detox and alcohol treatment three weeks ago. In talking to the patient, we realized that he has participated in every program available in our community and at each time he left prematurely or was behaving in a disruptive way or was unhappy when he was not allowed to smoke. He had reports that he has a long history of anger control and irritability, He does not feel that his condition requires medication. He thinks of himself as a simple drunk. He denies other substance use. There is no history of depression or psychosis. He denies symptoms suggestive of bipolar mania.   PAST PSYCHIATRIC HISTORY: There were multiple, multiple admissions for detox [place] and in the past six months in [place]. He denies suicide attempts.   FAMILY PSYCHIATRIC HISTORY: None  reported.   PAST MEDICAL HISTORY: None.   ALLERGIES: Lisinopril.  MEDICATIONS ON ADMISSION: None.   SOCIAL HISTORY: He is originally from [place]. He relocated to [place] six months ago. His mother lives here. In the past week he spent one night at his mother's place. Apparently, she does not tolerate his drinking. He is currently homeless, has no income, no health insurance.   REVIEW OF SYSTEMS: CONSTITUTIONAL: No fevers or chills. No weight changes. EYES: No double or blurred vision. ENT: No hearing loss. RESPIRATORY: No shortness of breath or cough. CARDIOVASCULAR: No chest pain or orthopnea. GASTROINTESTINAL: No abdominal pain, nausea, vomiting, or diarrhea. GU: No incontinence or frequency. ENDOCRINE: No heat or cold intolerance. LYMPHATIC: No anemia or easy bruising. INTEGUMENTARY: No acne or rash. MUSCULOSKELETAL: No muscle or joint pain. NEUROLOGIC: No tingling or weakness. PSYCHIATRIC: See history of present illness for details.   PHYSICAL EXAMINATION:  VITAL SIGNS: Blood pressure 167/84, pulse 105, respirations 20.   GENERAL: This is a well-developed male in no acute distress. The rest of the physical examination is deferred to his primary attending.   LABORATORY DATA: Chemistries are within normal limits. Blood alcohol level is 0. LFTs within normal limits except for AST of 46. TSH 0.941. Urine tox screen negative for substances. CBC within normal limits except for white blood count of 14.9. Urinalysis is not suggestive of urinary tract infection.   MENTAL STATUS EXAMINATION: The patient is alert and oriented to person, place, time,  and situation. He is pleasant, polite, and cooperative. He is in bed wearing hospital scrubs. He maintains good eye contact. His speech is soft. Mood is fine with anxious affect. Thought processing is logical and goal oriented. Thought content: He denies suicidal or homicidal ideation. There are no delusions or paranoia. There are no auditory  or visual hallucinations. His cognition is grossly intact. He registers three out of three and recalls three out of three objects after five minutes. He can spell world forwards and backwards. He knows the current president. His insight and judgment are questionable.   SUICIDE RISK ASSESSMENT: This is a patient with a long history of alcoholism but no mood problems or suicide attempts who exhausted his option of treatment and housing in our area, but really has no intention to stop drinking.   DIAGNOSES:  AXIS I: Alcohol dependence.   AXIS II: Deferred.   AXIS III: None.   AXIS IV: Substance abuse, employment, housing, financial, primary support, access to care, poor insight into his problems.   AXIS V: Global assessment of functioning score is 45.   PLAN:  1. The patient does not meet criteria for involuntary inpatient psychiatric commitment. Please discharge as appropriate.  2. We tried to refer the patient to the [facility] [place], but he will not be accepted there as he cannot come up with sound disposition plan following treatment.  3. [facility]. He will be given a second chance there. He will need to get a taxi to [facility]. 4. No medication recommended at this time.  5. The patient did not require alcohol detox.   ____________________________ Ellin GoodieJolanta B. Jennet MaduroPucilowska, MD jbp:ap D: 09/04/2012 11:19:32 ET T: 09/04/2012 14:01:59 ET JOB#: [id number]  cc: Avarae Zwart B. Jennet MaduroPucilowska, MD, <Dictator>  Shari ProwsJOLANTA B Jacoba Cherney MD ELECTRONICALLY SIGNED 09/06/2012 1:57

## 2015-03-30 NOTE — Consult Note (Signed)
Brief Consult Note: Diagnosis: Alcohol dependence, Malingering.   Patient was seen by consultant.   Consult note dictated.   Recommend further assessment or treatment.   Orders entered.   Discussed with Attending MD.   Comments: [name] has a h/o alcoholism. He has not been able to maintain any sobriety except in supervised setting. He has been in our area for about six months and tried to participate in multiple substance abuse treatment programs. He has not been successful in any of them. He has a h/o leaning treatment prematurely.   PLAN: 1. The patient does not meet criteria for IVC.  2. The patient was accepted at a [address] program [place]. He will need a taxi ride.   3. IF accepted, he will need a taxi to get there.  Electronic Signatures: Kristine LineaPucilowska, Cully Luckow (MD)  (Signed 25-Sep-13 11:22)  Authored: Brief Consult Note   Last Updated: 25-Sep-13 11:22 by Kristine LineaPucilowska, Kely Dohn (MD)

## 2015-04-02 NOTE — Consult Note (Signed)
PATIENT NAME:  Kurt Harris, Kurt Harris MR#:  [id number] DATE OF BIRTH:  11/03/1956  DATE OF CONSULTATION:  07/10/2013  REFERRING PHYSICIAN:  Lurena Joinerebecca L. Shaune PollackLord, MD CONSULTING PHYSICIAN:  Ardeen FillersUzma S. Garnetta BuddyFaheem, MD  REASON FOR CONSULTATION: Alcohol detoxification.   HISTORY OF PRESENT ILLNESS: The patient is a 59 year old homeless male who presented to the ED requesting detox as he was drinking 2 cases of beer on a daily basis. He reported that he has history of DTs and withdrawal seizures. During my interview, the patient reported that he has been having lots of anxiety as he has been consuming 2 to 24 packs of beer per day. He reported that his last beer was yesterday. He stated that he gets the beer from the family and friends. He reported that most of them provide the beer for free. He stated that he has not been sleeping well and has been having increased anxiety. He reported that he is not using any drugs besides alcohol at this time. He stated that he smokes cigarettes whenever he can get it. He stated that he also feels "racy" as he talks fast and annoys people. The patient reported that he is not having any suicidal ideations at this time, but he is unable to control himself. He also mentioned that he has been homeless as he was living with his mother in a senior living and cannot return back to her. The patient reported that he has a history of multiple detoxes in the past and cannot return to any of those places. He is interested in going to a rehab program at this time.   PAST PSYCHIATRIC HISTORY: The patient reported that he has attended multiple detox programs [facility] [address], [address] [facility], [address] and [facility]. However, he failed all those programs and continues to drink alcohol on a regular basis. He also mentioned about a suicide attempt by overdose on the decongestants 20 years ago. He stated that he is not taking any psychotropic medication at this time. He is interested in starting some medication to control his  behavior.   MEDICAL HISTORY: The patient reported that he has uncontrolled pain which is chronic, but he is not taking any medication for the same. He was also diagnosed with hypertension but is not taking any medication for the same.   FAMILY HISTORY: The patient reported that his mother has been diagnosed with heart problems, emphysema and glaucoma. She does not have any history of anxiety or depression.   SOCIAL HISTORY: The patient reported, "I'm dead, I'm not allowed to go anywhere." However, he reported that he is interested in going to a rehab program so he can start his life over again and then control his behavior and to stop drinking alcohol. He stated that nobody supports him, and he wants to get sober and start taking medication to control his relapses on the alcohol. The patient stated that he was married 1 time in the past, which lasted only 2 years. He has been living in the homeless shelter for many years in the past. He stated that he was to the [facility] twice as well as to [facility] time in the past. He currently denied any pending legal charges.   VITAL SIGNS: Temperature 98.3, pulse 84, respirations 18, blood pressure 122/76.  LABORATORY DATA: Glucose 83, BUN 11, creatinine 1.07, sodium 136, potassium 3.7, chloride 104, bicarbonate 27, anion gap 5, osmolality 270, calcium 9.1. Blood alcohol level 156. Protein 7.5, albumin 4.2, bilirubin 1.0, alkaline phosphatase 82,  AST 18, ALT 22. TSH 0.591. Urine drug screen negative. WBC was 10.4, RBC 4.86, hemoglobin 12.5, hematocrit 38.1, platelet count 324, MCV 78, MCH 25.7, RDW 16.2.   REVIEW OF SYSTEMS:  CONSTITUTIONAL: Denies any fever or chills. No weight changes.  EYES: No double or blurred vision.  RESPIRATORY: No shortness of breath or cough.  CARDIOVASCULAR: No chest pain or orthopnea.  GASTROINTESTINAL: No abdominal pain, nausea, vomiting, diarrhea.  GENITOURINARY: No incontinence or frequency.  ENDOCRINE: No heat or cold  intolerance.  LYMPHATIC: No anemia or easy bruising.  INTEGUMENTARY: No acne or rash.  MUSCULOSKELETAL: Denies any muscle or joint pain.   MENTAL STATUS EXAMINATION: The patient is a thinly built male who appeared his stated age. He was somewhat anxious. Mood was depressed and anxious. Affect was congruent. Thought process was tangential. Thought content was non-delusional. He is not exhibiting any withdrawal symptoms at this time. He demonstrated poor insight and judgment regarding his use of alcohol.   DIAGNOSTIC IMPRESSION:  AXIS I:  1. Alcohol dependence.  2. Alcohol withdrawal.  AXIS II: None.  AXIS III: Chronic pain, hypertension.   TREATMENT PLAN: The patient will be started on the involuntary commitment as he is unable to control his drinking, and he will be transferred to the [facility] for safety and stabilization and to get some rehab from his alcohol use. I discussed with the patient about the same, and he demonstrated understanding. He is currently on CIWA protocol to prevent him from going into withdrawal from alcohol. The patient will be transferred as a bed is available at [facility] at this time.   Thank you for allowing me to participate in the care of this patient.   ____________________________ Ardeen Fillers. Garnetta Buddy, MD usf:OSi D: 07/10/2013 13:01:32 ET T: 07/10/2013 13:24:40 ET JOB#: [id number]  cc: Ardeen Fillers. Garnetta Buddy, MD, <Dictator> Rhunette Croft MD ELECTRONICALLY SIGNED 07/15/2013 12:36

## 2015-04-03 NOTE — Consult Note (Signed)
PATIENT NAME:  Kurt Harris, Kurt Harris MR#:  [id number] DATE OF BIRTH:  Nov 07, 1956  DATE OF CONSULTATION:  07/15/2014  CONSULTING PHYSICIAN:  Kurt Amel, MD  IDENTIFYING INFORMATION AND REASON FOR CONSULTATION: A 59 year old gentleman who presented himself to the Emergency Room.   CHIEF COMPLAINT: "I'm suicidal."   Consultation for appropriate disposition.   HISTORY OF PRESENT ILLNESS: Information obtained from the patient and the chart. The patient came in saying that he is very depressed and having suicidal thoughts. He is thinking about walking out in front of traffic or taking an overdose of pills. He cites the fact that his mother and sister are both sick and he is worried about them dying as being big stresses for him. He also has had just a general poor time functioning himself. He has been staying at the homeless shelter for awhile and does not like it. He tells me that he does sometimes hear voices calling his name. Denies any other hallucinations. He is not currently taking any prescription medicine. Additionally, he tells me that he has not been drinking recently. This gentleman has been to our Emergency Room several times, most recently in June, and all of his previous admissions have involved acute alcohol detox. He tells me that he has not been drinking for at least weeks now.   PAST PSYCHIATRIC HISTORY: Claims that he has never been psychiatrically hospitalized for anything except substance abuse. Says he has never been on any prescription medicine for depression or other psychiatric illness. He denies ever suicide attempts.   PAST MEDICAL HISTORY: He says he has been told he has high blood pressure, but he is not taking any medicines. Denies any other medical diagnoses. He is complaining of severe constipation currently.   FAMILY HISTORY: Denies any family history of mental illness.   CURRENT MEDICATIONS: None.   ALLERGIES: LISINOPRIL.   SOCIAL HISTORY: The patient has been living  at the homeless shelter. Says that he has relatives, but none of them he can stay with. 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 He says that his mind just does not work right anymore. He has no children.   REVIEW OF SYSTEMS: Complains of acute constipation. Also that he is having some rectal bleeding along with that. He also complains of depressed mood, suicidal ideation, occasional hallucinations fatigue, poor sleep at night.   MENTAL STATUS EXAMINATION: Disheveled gentleman looks his stated age, cooperative with the interview. Eye contact intermittent. Psychomotor activity normal. Speech is quiet, decreased in total amount. Affect is flat and blunted. Mood is stated as being depressed. Thoughts are a little bit scattered and disorganized, but not grossly bizarre. He does not report anything obviously paranoid but says that he hears voices calling his name at times. Endorses suicidal ideation with somewhat vague plans. No homicidal ideation. He is alert and oriented x 4. He could remember 2 out of 3 objects at 3 minutes. Baseline intelligence, probably average. Judgment and insight are intact.    VITAL SIGNS: Most recent vital signs have not current been taken.   LABORATORY RESULTS: Drug screen is all negative. Urinalysis unremarkable. Chemistry panel unremarkable. Slightly anemic with a low hemoglobin at 12.2, low hematocrit 38.5.   ASSESSMENT: A 59 year old man with a history of alcohol dependence, who is presenting apparently sober right now, although we do not have an alcohol level back. He is reporting multiple symptoms of major depression with suicidal  ideation. He seems to be a little bit slow in his thinking with poor coping skills. Describes multiple symptoms consistent with depression.   TREATMENT PLAN: Admit patient to psychiatry. Suicide precautions for now. Primary team can evaluate about treatment of his depression.   DIAGNOSIS,  PRINCIPAL AND PRIMARY:   AXIS I: Major depression, severe, single.   SECONDARY DIAGNOSES:  AXIS I: Alcohol dependence, in early remission.   AXIS II: Deferred.   AXIS III: History of hypertension.   AXIS IV: Severe from homelessness.   AXIS V: Functioning at time of evaluation is 40.    ____________________________ Kurt AmelJohn T. Ahlaya Ende, MD jtc:lt D: 07/15/2014 16:20:07 ET T: 07/15/2014 16:28:50 ET JOB#: [id number]  cc: Kurt AmelJohn T. Myasia Sinatra, MD, <Dictator> Kurt AmelJOHN T Jordi Lacko MD ELECTRONICALLY SIGNED 08/06/2014 22:43

## 2015-04-03 NOTE — Consult Note (Signed)
PATIENT NAME:  Kurt Harris, Kurt Harris MR#:  [id number] DATE OF BIRTH:  1956/10/21  DATE OF CONSULTATION:  05/21/2014  REFERRING PHYSICIAN:  Minna AntisKevin Paduchowski, MD CONSULTING PHYSICIAN:  Ardeen FillersUzma S. Garnetta BuddyFaheem, MD  REASON FOR CONSULTATION:  "I am here to get detoxed from alcohol."   HISTORY OF PRESENT ILLNESS:  The patient is a 59 year old male who presented to the ER requesting detox from alcohol. He reported that he does not feel good. He reported that he needs detox from alcohol and has been consuming 12 to 24 packs of alcohol per day. Reported that he consumed a 12 pack yesterday. However, he does not have a UDS that is positive for alcohol or for any other drugs. He reported that he was brought to the hospital by the police in [place]. He is currently homeless. He currently denied having any withdrawal symptoms including blackouts or seizures as well as shakes. He reported that he never had withdrawal seizures. The patient reported that he does not use any drugs. The patient reported that he was living in [facility] [place] for the past 2 months, but then his time ran out and he became homeless. He was supporting himself by panhandling. He was asking me if he can stay in the hospital and can sleep. He reported that he has been feeling tired. The patient reported that he does not have any suicidal or homicidal ideations or plans. Does not have any auditory or visual hallucinations.   PAST PSYCHIATRIC HISTORY:  The patient reported that he has history of suicidal ideations in the past when he was in [place] and he was living on the streets. He reported that he has never attempted suicide. He reported that he does not have any history of psychiatric hospitalization. He does not take any psychotropic medications at this time.   SUBSTANCE ABUSE HISTORY: The patient reported that he has a long history of using drugs and alcohol. He reported that he consumes alcohol since he was 59 years old. He drinks approximately 12  to 24 beers on a daily basis. His last use was yesterday, but his alcohol level is zero. He denied any history of blackouts or seizures. The patient reported that he went to [facility] in the past. He denies having any withdrawal seizures at this time.   ALLERGIES:  LISINOPRIL.   SOCIAL HISTORY:  The patient reported that he has never been married and does not have any children. [place] is originally from [place] and moved to [place] in [place]. He completed  11th grade of education. He stated that he does not have any pending legal charges.   REVIEW OF SYSTEMS:  CONSTITUTIONAL:  Denies any fever or chills. No weight changes.  EYES:  No double or blurred vision.  RESPIRATORY:  No shortness of breath or cough.  CARDIOVASCULAR:  No chest pain or orthopnea.  GASTROINTESTINAL:  No abdominal pain, nausea, vomiting or diarrhea.  GENITOURINARY:  No incontinence or frequency.  ENDOCRINE:  No heat or cold intolerance.  LYMPHATIC:  No anemia or easy bruising.  INTEGUMENTARY:  No acne or rash.  MUSCULOSKELETAL:  No muscle or joint pain.  NEUROLOGIC:  No tingling or weakness.   VITAL SIGNS: Temperature 97.7, pulse 88, respirations 18, blood pressure 133/82.   LABORATORY DATA:  Glucose 96, BUN 21, creatinine 0.98, sodium 136, potassium 3.6, chloride anion gap 6, osmolality 275. Blood alcohol level less than 3. Protein 6.9, albumin 4.0, bilirubin 0.8, alkaline phosphatase  78, AST 18, ALT 23. UDS  is negative. WBC 9.8, RBC 4.49, hemoglobin 11.1, hematocrit 35.4, platelet count 276, MCV 79, RDW  15.5.   MENTAL STATUS EXAMINATION:  General appearance:  The patient is a moderately built male who appeared his stated age. His muscle tone appears normal. Gait and station were within normal limits. Speech was low in tone and volume. Thought process was logical, goal-directed. No loose associations were noted. Thought content was nondelusional. He denied having any suicidal or homicidal ideations or plans. His insight  and judgment were fair. Awake, alert and oriented x 3. Recent and remote memory were intact. Attention span and concentration were normal. His language and fund of knowledge appears appropriate. His mood was fine. Affect was congruent.   DIAGNOSTIC IMPRESSION: AXIS I:  Alcohol dependence.  AXIS II:  None reported.  AXIS III:  None reported.   TREATMENT PLAN: 1.  The patient is currently does not exhibit any withdrawal symptoms from alcohol. He will be discharged from the behavioral health unit  The patient was doing information about the same. He is not exhibiting any withdrawal symptoms. I advised the patient to come back to the hospital if he needs further care. He demonstrated understanding. He will be discharged from the hospital in stable condition.   Thank you for allowing me to participate in the care of this patient.   ____________________________ Ardeen Fillers. Garnetta Buddy, MD usf:dmm D: 05/21/2014 13:07:01 ET T: 05/21/2014 13:20:20 ET JOB#: [id number]  cc: Ardeen Fillers. Garnetta Buddy, MD, <Dictator> Rhunette Croft MD ELECTRONICALLY SIGNED 05/28/2014 16:52

## 2015-04-03 NOTE — Discharge Summary (Signed)
PATIENT NAME:  Kurt Harris, Kurt Harris MR#:  [id number] DATE OF BIRTH:  1956/05/01  DATE OF ADMISSION:  07/15/2014  DATE OF DISCHARGE:  07/17/2014  REASON FOR ADMISSION: Suicidal ideation.  DISCHARGE DIAGNOSIS:  AXIS I:  Alcohol use disorder.  AXIS II: Fair. AXIS III: None. AXIS IV: Homelessness, lack of medical insurance, limited support. AXIS V: GAF of 50.   DISCHARGE MEDICATIONS:  None.    HOSPITAL COURSE:  Mr. Tiburcio Peailsen presented to [facility] requesting psychiatric evaluation.  During evaluation, the patient reported having depression and suicidal ideation.  The patient also explained that he had been living at a shelter for about a month, however, he missed a curfew and lost his bed at the shelter and did not have any other place to go.  [facility].  During his stay in the unit the patient was uncooperative.  He was withdrawn, rude to the staff and disrespectful at times.  He did not participate in any therapeutic activities in the unit.  He was uncooperative with the psychiatric assessment.  The patient reported to staff  that he had only said that he was suicidal because he needed a place to stay.  Per the record, the patient has been in our Emergency Department at least twice before for alcohol withdrawal.  At this admission, the patient reported being clean for weeks and did not develop any withdrawal symptoms during his stay.  On the day of discharge, the patient continued to report vague suicidal ideation, however, once again he had reported earlier to the staff that he had only said that he was suicidal in order to get admitted.  The patient denied any homicidal ideation or hallucinations.    MENTAL STATUS EXAMINATION: At the time of the discharge, the patient was alert.  He was oriented in person, place, time and situation.  His behavior was uncooperative.  Psychomotor activity was mildly decreased.  His  speech had regular, volume and rate.  Thought process linear.  Thought content negative for suicidality, homicidality, perception.  Negative for psychosis.  His mood was irritable, his affect congruent.  Insight and judgment are limited.  Suicide risk assessment was completed in the patient (please see suicide risk assessment note) and the patient was felt to have a low acute risk for suicide and therefore no longer needed inpatient psychiatric hospitalization.    LABORATORY RESULTS:  BUN 12, creatinine 1.1, GFR is greater than 60.  Calcium 8.8.  AST 19, ALT 22.  Urine tox screen was negative.  Hematology screen showed a hemoglobin of 12.2, a hematocrit of 38.5 and a platelet count of 281.    DISCHARGE DISPOSITION: The patient will be referred to [facility]  DISCHARGE FOLLOWUP: The patient will sent out to followup with a community mental health center in order to address substance abuse issues, however, no psychiatric diagnosis was given during this hospitalization.     ____________________________ Jimmy FootmanAndrea Hernandez-Gonzalez, MD ahg:jh D: 07/17/2014 14:09:18 ET T: 07/17/2014 23:52:45 ET JOB#: [id number]  cc: Jimmy FootmanAndrea Hernandez-Gonzalez, MD, <Dictator> Horton ChinANDREA HERNANDEZ GONZAL MD ELECTRONICALLY SIGNED 07/20/2014 13:00
